# Patient Record
Sex: Female | Born: 1965 | Hispanic: Yes | Marital: Single | Smoking: Never smoker
Health system: Southern US, Community
[De-identification: ages and names within clinical notes are randomized; demographics above are authoritative.]

---

## 2007-07-14 ENCOUNTER — Ambulatory Visit: Payer: Self-pay

## 2007-07-16 ENCOUNTER — Ambulatory Visit: Payer: Self-pay

## 2008-03-04 HISTORY — PX: BREAST CYST ASPIRATION: SHX578

## 2008-08-10 ENCOUNTER — Ambulatory Visit: Payer: Self-pay

## 2008-10-07 ENCOUNTER — Emergency Department: Payer: Self-pay | Admitting: Emergency Medicine

## 2009-08-11 ENCOUNTER — Ambulatory Visit: Payer: Self-pay | Admitting: Family Medicine

## 2011-01-28 ENCOUNTER — Ambulatory Visit: Payer: Self-pay | Admitting: Family Medicine

## 2011-01-31 ENCOUNTER — Ambulatory Visit: Payer: Self-pay | Admitting: Family Medicine

## 2012-10-06 ENCOUNTER — Ambulatory Visit: Payer: Self-pay | Admitting: Family Medicine

## 2013-12-15 ENCOUNTER — Ambulatory Visit: Payer: Self-pay

## 2015-02-01 ENCOUNTER — Ambulatory Visit: Payer: Self-pay | Attending: Oncology

## 2015-02-01 ENCOUNTER — Ambulatory Visit
Admission: RE | Admit: 2015-02-01 | Discharge: 2015-02-01 | Disposition: A | Discharge status: Home / Self Care | Payer: Self-pay | Source: Ambulatory Visit | Attending: Oncology | Admitting: Oncology

## 2015-02-01 VITALS — BP 105/71 | HR 83 | Temp 99.3°F | Resp 16 | Ht 62.21 in | Wt 156.4 lb

## 2015-02-01 DIAGNOSIS — Z0000 Encounter for general adult medical examination without abnormal findings: Secondary | ICD-10-CM

## 2015-02-01 NOTE — Progress Notes (Signed)
Subjective:     Patient ID: Seward CarolAlva Cruz Cigarroa, female   DOB: 07/09/1965, 49 y.o.   MRN: [id number]  HPI   Review of Systems     Objective:   Physical Exam  Pulmonary/Chest: Right breast exhibits no inverted nipple, no mass, no nipple discharge, no skin change and no tenderness. Left breast exhibits no inverted nipple, no mass, no nipple discharge, no skin change and no tenderness. Breasts are symmetrical.       Assessment:     49 year old hispanic patient presents for [facility] clinic visit.  Patient screened, and meets [facility] eligibility.  Patient does not have insurance, Medicare or Medicaid.  Handout given on Affordable Care Act.  CBE unremarkable.  Instructed patient on breast self-exam using teach back method.    Plan:     Sent for bilateral screening mammogram.   Kristeen MansMaritza Afanador interpreted exam.

## 2015-02-08 NOTE — Progress Notes (Unsigned)
Letter mailed from [facility] to notify of normal mammogram results.  Patient to return in one year for annual screening.  Copy to [facility]. 

## 2016-02-05 ENCOUNTER — Encounter: Payer: Self-pay | Admitting: *Deleted

## 2016-02-05 ENCOUNTER — Ambulatory Visit
Admission: RE | Admit: 2016-02-05 | Discharge: 2016-02-05 | Disposition: A | Discharge status: Home / Self Care | Payer: Self-pay | Source: Ambulatory Visit | Attending: Oncology | Admitting: Oncology

## 2016-02-05 ENCOUNTER — Ambulatory Visit: Payer: Self-pay | Attending: Oncology | Admitting: *Deleted

## 2016-02-05 VITALS — BP 127/83 | HR 80 | Temp 98.1°F | Ht 62.6 in | Wt 158.5 lb

## 2016-02-05 DIAGNOSIS — Z0000 Encounter for general adult medical examination without abnormal findings: Secondary | ICD-10-CM

## 2016-02-05 NOTE — Patient Instructions (Signed)
Gave patient hand-out, Women Staying Healthy, Active and Well from [facility], with education on breast health, pap smears, heart and colon health. 

## 2016-02-05 NOTE — Progress Notes (Signed)
Subjective:     Patient ID: Kathryn May, female   DOB: 09/24/1965, 50 y.o.   MRN: [id number]  HPI   Review of Systems     Objective:   Physical Exam  Pulmonary/Chest: Right breast exhibits no inverted nipple, no mass, no nipple discharge, no skin change and no tenderness. Left breast exhibits no inverted nipple, no mass, no nipple discharge, no skin change and no tenderness. Breasts are symmetrical.         Assessment:     50 year old Hispanic [facility]  Maritza, the interpreter present during the interview and exam.  Patient states she found a nodule in the right breast about 4 months ago.  States her primary care provider told her it was probably related to menstrual cycle.  Patient states the nodule went away after her period.  On clinical breast exam there is no dominant mass, skin changes, nipple discharge or lymphadenopathy.  Taught self breast awareness.  Patient has been screened for eligibility.  She does not have any insurance, Medicare or Medicaid.  She also meets financial eligibility.  Hand-out given on the Affordable Care Act.    Plan:     Screening mammogram ordered.  Will follow-up per [facility] protocol.

## 2016-02-08 ENCOUNTER — Encounter: Payer: Self-pay | Admitting: *Deleted

## 2016-02-08 NOTE — Progress Notes (Signed)
Letter mailed to inform patient of her normal mammogram.  She is to return in one year for her annual screening.  [facility] to Dayhristy.

## 2017-02-05 ENCOUNTER — Ambulatory Visit: Payer: Self-pay | Attending: Oncology | Admitting: *Deleted

## 2017-02-05 ENCOUNTER — Encounter: Payer: Self-pay | Admitting: *Deleted

## 2017-02-05 ENCOUNTER — Ambulatory Visit
Admission: RE | Admit: 2017-02-05 | Discharge: 2017-02-05 | Disposition: A | Discharge status: Home / Self Care | Payer: Self-pay | Source: Ambulatory Visit | Attending: Oncology | Admitting: Oncology

## 2017-02-05 VITALS — BP 109/67 | HR 72 | Temp 97.5°F | Resp 18 | Ht 63.0 in | Wt 156.0 lb

## 2017-02-05 DIAGNOSIS — Z0000 Encounter for general adult medical examination without abnormal findings: Secondary | ICD-10-CM

## 2017-02-05 NOTE — Progress Notes (Signed)
Subjective:     Patient ID: Kathryn May, female   DOB: 1965-11-05, 51 y.o.   MRN: [id number]  HPI   Review of Systems     Objective:   Physical Exam  Pulmonary/Chest: Right breast exhibits no inverted nipple, no mass, no nipple discharge, no skin change and no tenderness. Left breast exhibits no inverted nipple, no mass, no nipple discharge, no skin change and no tenderness. Breasts are symmetrical.       Assessment:     51 year old Hispanic [facility]  [name], the interpreter present during the interview and exam.  Patient states her last pap on 9/17 was normal.  There is no pap report available for review.  Informed patient her next pap should be due in 2020 or 2022.  Clinical breast exam unremarkable.  Taught self breast awareness.  Patient has been screened for eligibility.  She does not have any insurance, Medicare or Medicaid.  She also meets financial eligibility.  Hand-out given on the Affordable Care Act.    Plan:     Screening mammogram ordered.  Will follow-up per [facility] protocol.

## 2017-02-05 NOTE — Patient Instructions (Signed)
Gave patient hand-out, Women Staying Healthy, Active and Well from [facility], with education on breast health, pap smears, heart and colon health. 

## 2017-02-06 ENCOUNTER — Encounter: Payer: Self-pay | Admitting: *Deleted

## 2017-02-06 NOTE — Progress Notes (Signed)
Letter mailed from [facility] to inform patient of her normal mammogram results.  Patient is to follow-up with annual screening in one year.  [facility] to Christy. 

## 2018-01-23 ENCOUNTER — Emergency Department
Admission: EM | Admit: 2018-01-23 | Discharge: 2018-01-23 | Disposition: A | Discharge status: Home / Self Care | Payer: Self-pay | Attending: Student in an Organized Health Care Education/Training Program | Admitting: Student in an Organized Health Care Education/Training Program

## 2018-01-23 ENCOUNTER — Encounter: Payer: Self-pay | Admitting: Emergency Medicine

## 2018-01-23 ENCOUNTER — Other Ambulatory Visit: Payer: Self-pay

## 2018-01-23 DIAGNOSIS — B029 Zoster without complications: Secondary | ICD-10-CM | POA: Insufficient documentation

## 2018-01-23 DIAGNOSIS — L299 Pruritus, unspecified: Secondary | ICD-10-CM | POA: Insufficient documentation

## 2018-01-23 MED ORDER — HYDROCODONE-ACETAMINOPHEN 5-325 MG PO TABS: 1.0000 | ORAL_TABLET | Freq: Four times a day (QID) | ORAL | 0 refills | Status: AC | PRN

## 2018-01-23 MED ORDER — HYDROXYZINE HCL 25 MG PO TABS: 25.0000 mg | ORAL_TABLET | Freq: Four times a day (QID) | ORAL | 0 refills | Status: AC | PRN

## 2018-01-23 MED ORDER — ACYCLOVIR 400 MG PO TABS: 800.0000 mg | ORAL_TABLET | Freq: Every day | ORAL | 0 refills | Status: AC

## 2018-01-23 MED ORDER — DIPHENHYDRAMINE HCL 25 MG PO CAPS
25.0000 mg | ORAL_CAPSULE | Freq: Once | ORAL | Status: AC
  Administered 2018-01-23: 25 mg via ORAL
  Filled 2018-01-23: qty 1

## 2018-01-23 MED ORDER — HYDROCODONE-ACETAMINOPHEN 5-325 MG PO TABS
1.0000 | ORAL_TABLET | Freq: Once | ORAL | Status: AC
  Administered 2018-01-23: 1 via ORAL
  Filled 2018-01-23: qty 1

## 2018-01-23 NOTE — ED Notes (Addendum)
See triage note  States she developed pain to left lower back which is moving into left leg   States pain started 4 days ago  No injury .  Ambulates well to treatment room  Also has rash to  Same leg

## 2018-01-23 NOTE — ED Provider Notes (Signed)
[facility] Emergency Department Provider Note  ____________________________________________   First MD Initiated Contact with Patient 01/23/18 1407     (approximate)  I have reviewed the triage vital signs and the nursing notes.   HISTORY  Chief Complaint Sciatica and Rash Spanish interpreter and family member.   HPI Kathryn May is a 52 y.o. female presents to the ED with complaint of left lower extremity and rash that began 4 days ago.  Patient states the rash is painful and also itches.  She denies rash on any other part of her body.  She denies any back injury.  Rates her pain as an 8 out of 10.   History reviewed. No pertinent past medical history.  There are no active problems to display for this patient.   Past Surgical History:  Procedure Laterality Date  . BREAST CYST ASPIRATION Right 2010    Prior to Admission medications   Medication Sig Start Date End Date Taking? Authorizing Provider  acyclovir (ZOVIRAX) 400 MG tablet Take 2 tablets (800 mg total) by mouth 5 (five) times daily for 7 days. 01/23/18 01/30/18  Tommi Rumps, PA-C  HYDROcodone-acetaminophen (NORCO/VICODIN) 5-325 MG tablet Take 1 tablet by mouth every 6 (six) hours as needed for moderate pain. 01/23/18   Tommi Rumps, PA-C  hydrOXYzine (ATARAX/VISTARIL) 25 MG tablet Take 1 tablet (25 mg total) by mouth every 6 (six) hours as needed for itching. 01/23/18   Tommi Rumps, PA-C    Allergies Patient has no known allergies.  Family History  Problem Relation Age of Onset  . Breast cancer Neg Hx     Social History Social History   Tobacco Use  . Smoking status: Never Smoker  . Smokeless tobacco: Never Used  Substance Use Topics  . Alcohol use: No    Alcohol/week: 0.0 standard drinks  . Drug use: No    Review of Systems Constitutional: No fever/chills Eyes: No visual changes. ENT: No sore throat. Cardiovascular: Denies chest  pain. Respiratory: Denies shortness of breath. Gastrointestinal: No abdominal pain.  No nausea, no vomiting.  No diarrhea.  No constipation. Genitourinary: Negative for dysuria. Musculoskeletal: Positive for low back pain with radiculopathy down the left leg. Skin: Positive for rash left leg. Neurological: Negative for headaches, focal weakness or numbness. ____________________________________________   PHYSICAL EXAM:  VITAL SIGNS: ED Triage Vitals  Enc Vitals Group     BP 01/23/18 1315 137/76     Pulse Rate 01/23/18 1315 87     Resp 01/23/18 1315 16     Temp 01/23/18 1315 98.6 F (37 C)     Temp src --      SpO2 01/23/18 1315 98 %     Weight 01/23/18 1321 140 lb (63.5 kg)     Height 01/23/18 1321 5\' 5"  (1.651 m)     Head Circumference --      Peak Flow --      Pain Score 01/23/18 1321 8     Pain Loc --      Pain Edu? --      Excl. in GC? --    Constitutional: Alert and oriented. Well appearing and in no acute distress. Eyes: Conjunctivae are normal.  Head: Atraumatic. Neck: No stridor.   Cardiovascular: Normal rate, regular rhythm. Grossly normal heart sounds.  Good peripheral circulation. Respiratory: Normal respiratory effort.  No retractions. Lungs CTAB. Musculoskeletal: Examination of low back there is no gross deformity and no point tenderness on palpation.  There is some discomfort on palpation of the left SI joint area.  Range of motion is without restriction.  No muscle spasms were noted.  Patient is able to ambulate without assistance.  Good muscle strength bilaterally. Neurologic:  Normal speech and language. No gross focal neurologic deficits are appreciated. No gait instability. Skin:  Skin is warm, dry.  On examination of the left upper thigh there is an erythematous vesicular/papular rash involving the left lower extremity only.  These vesicles are noted in clusters.  Exam is consistent with herpes zoster.  Patient was undressed and rash was limited to the left  extremity only. Psychiatric: Mood and affect are normal. Speech and behavior are normal.  ____________________________________________   LABS (all labs ordered are listed, but only abnormal results are displayed)  Labs Reviewed - No data to display   PROCEDURES  Procedure(s) performed: None  Procedures  Critical Care performed: No  ____________________________________________   INITIAL IMPRESSION / ASSESSMENT AND PLAN / ED COURSE  As part of my medical decision making, I reviewed the following data within the electronic MEDICAL RECORD NUMBER Notes from prior ED visits and [place] Controlled Substance Database  Patient was given Norco and Benadryl while in the emergency department.  Per Spanish interpreter it was explained to her that herpes zoster was from the same virus causing chickenpox.  Patient acknowledges that she understands and that she did have chickenpox in her childhood.  Patient was given a prescription for Zovirax 800 mg 5 times per day for the next 7 days.  She is also given a prescription for Atarax if needed for itching and Norco if needed for pain.  Patient was also encouraged not to be around pregnant women or people receiving cancer.  She was given a note to remain out of work.  ____________________________________________   FINAL CLINICAL IMPRESSION(S) / ED DIAGNOSES  Final diagnoses:  Herpes zoster without complication     ED Discharge Orders         Ordered    acyclovir (ZOVIRAX) 400 MG tablet  5 times daily     01/23/18 1448    HYDROcodone-acetaminophen (NORCO/VICODIN) 5-325 MG tablet  Every 6 hours PRN     01/23/18 1448    hydrOXYzine (ATARAX/VISTARIL) 25 MG tablet  Every 6 hours PRN     01/23/18 1448           Note:  This document was prepared using Dragon voice recognition software and may include unintentional dictation errors.    Tommi RumpsSummers, Rhonda L, PA-C 01/23/18 1909    Willy Eddyobinson, Patrick, MD 01/24/18 [phone]

## 2018-01-23 NOTE — Discharge Instructions (Addendum)
See your primary care provider if any continued problems.  Take medication as prescribed. Return to the emergency department if any severe worsening of your symptoms.

## 2018-01-23 NOTE — ED Triage Notes (Signed)
Pt in via POV with complaints of sciatica pain from left hip radiating down leg.  Pt also reports waking up with hives/itching this morning.  Pt denies any new meds/soaps/foods.  Vitals WDL, NAD noted at this time.

## 2018-04-29 ENCOUNTER — Ambulatory Visit
Admission: RE | Admit: 2018-04-29 | Discharge: 2018-04-29 | Disposition: A | Discharge status: Home / Self Care | Payer: Self-pay | Source: Ambulatory Visit | Attending: Oncology | Admitting: Oncology

## 2018-04-29 ENCOUNTER — Ambulatory Visit: Payer: Self-pay | Attending: Oncology

## 2018-04-29 ENCOUNTER — Encounter (INDEPENDENT_AMBULATORY_CARE_PROVIDER_SITE_OTHER): Payer: Self-pay

## 2018-04-29 VITALS — BP 125/80 | HR 80 | Temp 98.3°F | Ht 61.0 in | Wt 156.7 lb

## 2018-04-29 DIAGNOSIS — Z0000 Encounter for general adult medical examination without abnormal findings: Secondary | ICD-10-CM | POA: Insufficient documentation

## 2018-04-29 NOTE — Progress Notes (Signed)
  Subjective:     Patient ID: Kathryn May, female   DOB: [birth year], 53 y.o.   MRN: [id number]  HPI   Review of Systems     Objective:   Physical Exam Chest:     Breasts:        Right: No swelling, bleeding, inverted nipple, mass, nipple discharge, skin change or tenderness.        Left: No swelling, bleeding, inverted nipple, mass, nipple discharge, skin change or tenderness.        Assessment:     53 year old hispanic patient presents for [facility] clinic visit.  Delos Haring interpreted exam.  Patient screened, and meets [facility] eligibility.  Patient does not have insurance, Medicare or Medicaid.  Handout given on Affordable Care Act.  Instructed patient on breast self awareness using teach back method.  Clinical breast exam unremarkable.  No mass or lump palpated.  Risk Assessment    Risk Scores      04/29/2018   Last edited by: Alta Corning, CMA   5-year risk: 0.6 %   Lifetime risk: 5.2 %             Plan:     Sent for bilateral screening mammogram.

## 2018-05-01 NOTE — Progress Notes (Signed)
Letter mailed from [facility] to notify of normal mammogram results.  Patient to return in one year for annual screening.  Copy to [facility]. 

## 2019-04-13 ENCOUNTER — Other Ambulatory Visit: Payer: Self-pay

## 2019-04-13 ENCOUNTER — Ambulatory Visit
Admission: RE | Admit: 2019-04-13 | Discharge: 2019-04-13 | Disposition: A | Discharge status: Home / Self Care | Payer: Self-pay | Source: Ambulatory Visit | Attending: Oncology | Admitting: Oncology

## 2019-04-13 ENCOUNTER — Ambulatory Visit: Payer: Self-pay | Attending: Oncology

## 2019-04-13 VITALS — BP 134/82 | HR 74 | Temp 96.9°F | Ht 62.0 in | Wt 168.0 lb

## 2019-04-13 DIAGNOSIS — Z0000 Encounter for general adult medical examination without abnormal findings: Secondary | ICD-10-CM | POA: Insufficient documentation

## 2019-04-13 DIAGNOSIS — N63 Unspecified lump in unspecified breast: Secondary | ICD-10-CM

## 2019-04-13 NOTE — Progress Notes (Signed)
  Subjective:     Patient ID: Kathryn May, female   DOB: August 21, 1965, 54 y.o.   MRN: [id number]  HPI   Review of Systems     Objective:   Physical Exam Chest:     Breasts:        Right: No swelling, bleeding, inverted nipple, mass, nipple discharge, skin change or tenderness.        Left: No swelling, bleeding, inverted nipple, mass, nipple discharge, skin change or tenderness.  Genitourinary:    Labia:        Right: No rash, tenderness, lesion or injury.        Left: No rash, tenderness, lesion or injury.      Vagina: No signs of injury and foreign body. No vaginal discharge, erythema, tenderness, bleeding, lesions or prolapsed vaginal walls.     Cervix: No cervical motion tenderness, discharge, friability, lesion, erythema, cervical bleeding or eversion.     Uterus: Not deviated, not enlarged, not fixed, not tender and no uterine prolapse.      Adnexa:        Right: No mass, tenderness or fullness.         Left: No mass, tenderness or fullness.          Assessment:  54 year old hispanic patient presents for [facility] clinic visit. Patient screened, and meets [facility] eligibility.  Patient does not have insurance, Medicare or Medicaid. Instructed patient on breast self awareness using teach back method. Clinical breast exam unremarkable. No mass or lump palpated.  Pelvic exam normal.     Risk Assessment    Risk Scores      04/13/2019 04/29/2018   Last edited by: Jim Like, RN [place], Freada Bergeron, CMA   5-year risk: 0.5 % 0.6 %   Lifetime risk: 4.3 % 5.2 %          Plan:     Sent for bilateral screening mammogram. Specimen collected for pap.

## 2019-04-21 LAB — IGP, APTIMA HPV: HPV Aptima: NEGATIVE

## 2019-04-22 NOTE — Progress Notes (Unsigned)
Normal pap results.  Additional views mammogram/ultrasound pending.

## 2019-04-30 ENCOUNTER — Ambulatory Visit
Admission: RE | Admit: 2019-04-30 | Discharge: 2019-04-30 | Disposition: A | Discharge status: Home / Self Care | Payer: Self-pay | Source: Ambulatory Visit | Attending: Oncology | Admitting: Oncology

## 2019-04-30 ENCOUNTER — Other Ambulatory Visit: Payer: Self-pay

## 2019-04-30 DIAGNOSIS — N63 Unspecified lump in unspecified breast: Secondary | ICD-10-CM

## 2019-05-04 ENCOUNTER — Other Ambulatory Visit: Payer: Self-pay | Admitting: *Deleted

## 2019-05-04 DIAGNOSIS — N63 Unspecified lump in unspecified breast: Secondary | ICD-10-CM

## 2019-05-06 ENCOUNTER — Ambulatory Visit
Admission: RE | Admit: 2019-05-06 | Discharge: 2019-05-06 | Disposition: A | Discharge status: Home / Self Care | Payer: Self-pay | Source: Ambulatory Visit | Attending: Oncology | Admitting: Oncology

## 2019-05-06 DIAGNOSIS — N63 Unspecified lump in unspecified breast: Secondary | ICD-10-CM

## 2019-05-06 HISTORY — PX: BREAST BIOPSY: SHX20

## 2019-05-07 LAB — SURGICAL PATHOLOGY

## 2019-05-18 ENCOUNTER — Other Ambulatory Visit: Payer: Self-pay

## 2019-05-18 DIAGNOSIS — Z0000 Encounter for general adult medical examination without abnormal findings: Secondary | ICD-10-CM

## 2019-05-18 DIAGNOSIS — N63 Unspecified lump in unspecified breast: Secondary | ICD-10-CM

## 2019-05-18 NOTE — Progress Notes (Signed)
Patient ID: Kathryn May, female   DOB: May 18, 1965, 54 y.o.   MRN: [id number] Radiologist discussed benign biopsy results with patient.  Scheduled for 6 month follow-up mammogram per radiology recommendation.  Mailed appointment information.

## 2019-07-10 ENCOUNTER — Ambulatory Visit: Payer: Self-pay | Attending: Internal Medicine

## 2019-07-10 DIAGNOSIS — Z23 Encounter for immunization: Secondary | ICD-10-CM

## 2019-07-10 NOTE — Progress Notes (Signed)
   Covid-19 Vaccination Clinic  Name:  Kathryn May    MRN: [id number] DOB: 07-20-1965  07/10/2019  Kathryn May was observed post Covid-19 immunization for 15 minutes without incident. She was provided with Vaccine Information Sheet and instruction to access the V-Safe system.   Kathryn May was instructed to call 911 with any severe reactions post vaccine: Marland Kitchen Difficulty breathing  . Swelling of face and throat  . A fast heartbeat  . A bad rash all over body  . Dizziness and weakness   Immunizations Administered    Name Date Dose VIS Date Route   Pfizer COVID-19 Vaccine 07/10/2019 12:16 PM 0.3 mL 04/28/2018 Intramuscular   Manufacturer: [employer], [employer]   Lot: [id number]   NDC: [address]-6

## 2019-07-13 ENCOUNTER — Ambulatory Visit: Payer: Self-pay

## 2019-08-03 ENCOUNTER — Ambulatory Visit: Payer: Self-pay | Attending: Internal Medicine

## 2019-08-03 DIAGNOSIS — Z23 Encounter for immunization: Secondary | ICD-10-CM

## 2019-08-03 NOTE — Progress Notes (Signed)
   Covid-19 Vaccination Clinic  Name:  Kathryn May    MRN: [id number] DOB: October 03, 1965  08/03/2019  Ms. Kathryn May was observed post Covid-19 immunization for 15 minutes without incident. She was provided with Vaccine Information Sheet and instruction to access the V-Safe system.   Ms. Kathryn May was instructed to call 911 with any severe reactions post vaccine: Marland Kitchen Difficulty breathing  . Swelling of face and throat  . A fast heartbeat  . A bad rash all over body  . Dizziness and weakness   Immunizations Administered    Name Date Dose VIS Date Route   Pfizer COVID-19 Vaccine 08/03/2019  9:10 AM 0.3 mL 04/28/2018 Intramuscular   Manufacturer: [employer], [employer]   Lot: [id number]   NDC: [address]-9

## 2019-11-09 ENCOUNTER — Ambulatory Visit
Admission: RE | Admit: 2019-11-09 | Discharge: 2019-11-09 | Disposition: A | Discharge status: Home / Self Care | Payer: Self-pay | Source: Ambulatory Visit | Attending: Oncology | Admitting: Oncology

## 2019-11-09 DIAGNOSIS — N63 Unspecified lump in unspecified breast: Secondary | ICD-10-CM | POA: Insufficient documentation

## 2019-11-09 DIAGNOSIS — Z0000 Encounter for general adult medical examination without abnormal findings: Secondary | ICD-10-CM | POA: Insufficient documentation

## 2020-06-27 ENCOUNTER — Ambulatory Visit
Admission: RE | Admit: 2020-06-27 | Discharge: 2020-06-27 | Disposition: A | Discharge status: Home / Self Care | Payer: Self-pay | Source: Ambulatory Visit | Attending: Oncology | Admitting: Oncology

## 2020-06-27 ENCOUNTER — Ambulatory Visit: Payer: Self-pay | Attending: Oncology | Admitting: *Deleted

## 2020-06-27 ENCOUNTER — Other Ambulatory Visit: Payer: Self-pay

## 2020-06-27 VITALS — BP 146/80 | HR 66 | Temp 97.6°F | Ht 62.0 in | Wt 167.2 lb

## 2020-06-27 DIAGNOSIS — Z0000 Encounter for general adult medical examination without abnormal findings: Secondary | ICD-10-CM | POA: Insufficient documentation

## 2020-06-27 NOTE — Progress Notes (Signed)
Subjective:     Patient ID: Kathryn May, female   DOB: 1965-05-17, 55 y.o.   MRN: [id number]  HPI   [facility] Medical History Record - 06/27/20 6962      Breast History   Screening cycle New    Initial Mammogram 06/27/20    Last Mammogram Annual    Last Mammogram Date 11/09/19    Provider (Mammogram)  Delford Field    Recent Breast Symptoms None      Breast Cancer History   Breast Cancer History No personal or family history      Previous History of Breast Problems   Breast Surgery or Biopsy Left   benign; March 2021   Breast Implants N/A    BSE Done Monthly      Gynecological/Obstetrical History   LMP --   2 weeks ago   Is there any chance that the client could be pregnant?  No    Age at menarche 54    Age at menopause perimenopausal    PAP smear history Annually    Date of last PAP  04/13/19    Provider (PAP) Bernestine Amass neg/neg    Age at first live birth 72    Breast fed children Yes (type length in comments)   2 years   DES Exposure No    Cervical, Uterine or Ovarian cancer No    Family history of Cervial, Uterine or Ovarian cancer No    Hysterectomy No    Cervix removed No    Ovaries removed No    Laser/Cryosurgery No    Current method of birth control --   tubal ligation   Current method of Estrogen/Hormone replacement None    Smoking history None    Comments no insurance             Review of Systems     Objective:   Physical Exam Chest:  Breasts:     Right: No swelling, bleeding, inverted nipple, mass, nipple discharge, skin change, tenderness, axillary adenopathy or supraclavicular adenopathy.     Left: No swelling, bleeding, inverted nipple, mass, nipple discharge, skin change, tenderness, axillary adenopathy or supraclavicular adenopathy.    Lymphadenopathy:     Upper Body:     Right upper body: No supraclavicular or axillary adenopathy.     Left upper body: No supraclavicular or axillary adenopathy.        Assessment:     55 year old  Hispanic female returns to [facility] for annual screening.  Loyda, the interpreter present during the interview and exam.  Clinical breast exam    Taught self breast awareness.  Last pap smear on 04/13/19 was negative / negative.  Next pap due in 2026.  Patient states she had a menstrual cycle 2 weeks ago.  It is unclear if this was post menopausal bleeding or if she is perimenopausal.  Patient states she has not had a period in a year, but then reported last year that she had not had a period since 2018.  Patient wast encouraged to discuss this last menstrual cycle with her PCP.  Patient has an appointment in 2 weeks.  Reviewed that post menopausal bleeding is abnormal and that a GYN consult may be needed.  Patient has been screened for eligibility.  She does not have any insurance, Medicare or Medicaid.  She also meets financial eligibility.   Risk Assessment    Risk Scores      06/27/2020 04/13/2019   Last edited by: Coralee Rud  F, RN Jim Like, RN   5-year risk: 0.6 % 0.5 %   Lifetime risk: 4.2 % 4.3 %            Plan:     Screening mammogram ordered.  Will follow up per [facility] protocol.

## 2020-06-27 NOTE — Patient Instructions (Signed)
Gave patient hand-out, Women Staying Healthy, Active and Well from [facility], with education on breast health, pap smears, heart and colon health. 

## 2020-06-28 ENCOUNTER — Encounter: Payer: Self-pay | Admitting: *Deleted

## 2020-06-28 NOTE — Progress Notes (Signed)
Letter mailed from [facility] to inform patient of her normal mammogram results.  Patient is to follow-up with annual screening in one year. 

## 2021-01-24 ENCOUNTER — Encounter: Payer: Self-pay | Admitting: Physician Assistant

## 2021-07-02 ENCOUNTER — Other Ambulatory Visit: Payer: Self-pay

## 2021-07-02 DIAGNOSIS — Z1211 Encounter for screening for malignant neoplasm of colon: Secondary | ICD-10-CM

## 2021-07-02 DIAGNOSIS — Z1231 Encounter for screening mammogram for malignant neoplasm of breast: Secondary | ICD-10-CM

## 2021-07-04 ENCOUNTER — Ambulatory Visit
Admission: RE | Admit: 2021-07-04 | Discharge: 2021-07-04 | Disposition: A | Discharge status: Home / Self Care | Payer: Self-pay | Source: Ambulatory Visit | Attending: Obstetrics and Gynecology | Admitting: Obstetrics and Gynecology

## 2021-07-04 ENCOUNTER — Ambulatory Visit: Payer: Self-pay | Attending: Hematology and Oncology | Admitting: *Deleted

## 2021-07-04 VITALS — BP 141/92 | Wt 173.0 lb

## 2021-07-04 DIAGNOSIS — Z1239 Encounter for other screening for malignant neoplasm of breast: Secondary | ICD-10-CM

## 2021-07-04 DIAGNOSIS — Z1231 Encounter for screening mammogram for malignant neoplasm of breast: Secondary | ICD-10-CM | POA: Insufficient documentation

## 2021-07-04 NOTE — Patient Instructions (Signed)
Explained breast self awareness with Kathryn May. Patient did not need a Pap smear today due to last Pap smear was in January or February 2023 per patient. Kathryn her know [facility] will cover Pap smears every 3 years unless has a history of abnormal Pap smears. Referred patient to the [facility] for a screening mammogram. Appointment scheduled Wednesday, Jul 04, 2021 following [facility] appointment. Patient aware of appointment and will be there. Kathryn patient know Kathryn May will follow up with her within the next couple weeks with results of mammogram by letter or phone. Kathryn May verbalized understanding. ? ?Kathryn May, Kathryn Maser, RN ?10:04 AM ? ? ? ? ?

## 2021-07-04 NOTE — Progress Notes (Signed)
Kathryn May is a 56 y.o. female [facility].  ?  ?Pap Smear: Pap smear not completed today. Last Pap smear was in January or February 2023 at [facility] and was normal per patient. Patient has history of an abnormal Pap smear 08/13/2002 that was ASCUS that no follow up was completed. Last Pap smear result is not available in Epic. Previous Pap smear result from 04/13/2019 is available in Epic. ?  ?Physical exam: ?Breasts ?Right breast slightly larger than left breast that per patient is normal for her. No skin abnormalities bilateral breasts. No nipple retraction bilateral breasts. No nipple discharge bilateral breasts. No lymphadenopathy. No lumps palpated bilateral breasts. No complaints of pain or tenderness on exam. ? ?MS DIGITAL SCREENING TOMO BILATERAL ? ?Result Date: 06/28/2020 ?CLINICAL DATA:  Screening. History of benign LEFT breast biopsy in 2021. EXAM: DIGITAL SCREENING BILATERAL MAMMOGRAM WITH TOMOSYNTHESIS AND CAD TECHNIQUE: Bilateral screening digital craniocaudal and mediolateral oblique mammograms were obtained. Bilateral screening digital breast tomosynthesis was performed. The images were evaluated with computer-aided detection. COMPARISON:  Previous exam(s). ACR Breast Density Category c: The breast tissue is heterogeneously dense, which may obscure small masses. FINDINGS: There are no findings suspicious for malignancy. The images were evaluated with computer-aided detection. IMPRESSION: No mammographic evidence of malignancy. A result letter of this screening mammogram will be mailed directly to the patient. RECOMMENDATION: Screening mammogram in one year. (Code:[id number]-B-01Y) BI-RADS CATEGORY  1: Negative. Electronically Signed   By: Bary RichardStan  Maynard M.D.   On: 06/28/2020 08:51  ? ?MS DIGITAL SCREENING TOMO BILATERAL ? ?Result Date: 04/13/2019 ?CLINICAL DATA:  Screening. EXAM: DIGITAL SCREENING BILATERAL MAMMOGRAM WITH TOMO AND CAD  COMPARISON:  Previous exam(s). ACR Breast Density Category c: The breast tissue is heterogeneously dense, which may obscure small masses. FINDINGS: In the left breast, possible distortion warrants further evaluation. In the right breast, no findings suspicious for malignancy. Images were processed with CAD. IMPRESSION: Further evaluation is suggested for possible distortion in the left breast. RECOMMENDATION: Diagnostic mammogram and possibly ultrasound of the left breast. (Code:[id number]-82M) The patient will be contacted regarding the findings, and additional imaging will be scheduled. BI-RADS CATEGORY  0: Incomplete. Need additional imaging evaluation and/or prior mammograms for comparison. Electronically Signed   By: Edwin CapJennifer  Jarosz M.D.   On: 04/13/2019 12:44  ? ?MS DIGITAL SCREENING TOMO BILATERAL ? ?Result Date: 04/29/2018 ?CLINICAL DATA:  Screening. EXAM: DIGITAL SCREENING BILATERAL MAMMOGRAM WITH TOMO AND CAD COMPARISON:  Previous exam(s). ACR Breast Density Category b: There are scattered areas of fibroglandular density. FINDINGS: There are no findings suspicious for malignancy. Images were processed with CAD. IMPRESSION: No mammographic evidence of malignancy. A result letter of this screening mammogram will be mailed directly to the patient. RECOMMENDATION: Screening mammogram in one year. (Code:[id number]-B-01Y) BI-RADS CATEGORY  1: Negative. Electronically Signed   By: Baird Lyonsina  Arceo M.D.   On: 04/29/2018 15:34  ? ?MS DIGITAL SCREENING TOMO BILATERAL ? ?Result Date: 02/05/2017 ?CLINICAL DATA:  Screening. EXAM: 2D DIGITAL SCREENING BILATERAL MAMMOGRAM WITH CAD AND ADJUNCT TOMO COMPARISON:  Previous exam(s). ACR Breast Density Category c: The breast tissue is heterogeneously dense, which may obscure small masses. FINDINGS: There are no findings suspicious for malignancy. Images were processed with CAD. IMPRESSION: No mammographic evidence of malignancy. A result letter of this screening mammogram will be mailed directly  to the patient. RECOMMENDATION: Screening mammogram in one year. (Code:[id number]-01Y) BI-RADS CATEGORY  1: Negative. Electronically Signed   By:  Sande Brothers M.D.   On: 02/05/2017 12:14  ? ?MS DIGITAL DIAG TOMO UNI LEFT ? ?Result Date: 11/09/2019 ?CLINICAL DATA:  Patient for short-term follow-up of biopsy site left breast demonstrating benign pathology. EXAM: DIGITAL DIAGNOSTIC UNILATERAL LEFT MAMMOGRAM WITH TOMO AND CAD COMPARISON:  Previous exam(s). ACR Breast Density Category d: The breast tissue is extremely dense, which lowers the sensitivity of mammography. FINDINGS: Stable post biopsy changes left breast. No new masses, calcifications or nonsurgical distortion identified. Mammographic images were processed with CAD. IMPRESSION: No mammographic evidence for malignancy. Post biopsy clip left breast. RECOMMENDATION: Return to annual screening mammography 04/2020. I have discussed the findings and recommendations with the patient. If applicable, a reminder letter will be sent to the patient regarding the next appointment. BI-RADS CATEGORY  2: Benign. Electronically Signed   By: Annia Belt M.D.   On: 11/09/2019 14:08  ? ?MS DIGITAL DIAG TOMO UNI LEFT ? ?Result Date: 04/30/2019 ?CLINICAL DATA:  The patient was called back for possible distortion in the left breast. EXAM: DIGITAL DIAGNOSTIC LEFT MAMMOGRAM WITH TOMO ULTRASOUND LEFT BREAST COMPARISON:  Previous exam(s). ACR Breast Density Category b: There are scattered areas of fibroglandular density. FINDINGS: Possible distortion in the superior left breast on the MLO view only persists on additional imaging. On physical exam, no suspicious lumps are identified. Targeted ultrasound is performed, showing no definitive sonographic correlate to the possible distortion in the left breast. No axillary adenopathy. IMPRESSION: Persistent possible distortion in the superior left breast on the MLO view only. RECOMMENDATION: Recommend stereotactic biopsy of the possible left  breast distortion. I have discussed the findings and recommendations with the patient. If applicable, a reminder letter will be sent to the patient regarding the next appointment. BI-RADS CATEGORY  4: Suspicious. Electronically Signed   By: Gerome Sam III M.D   On: 04/30/2019 10:49  ? ?MS DIGITAL DIAG UNI LEFT ? ?Result Date: 05/06/2019 ?CLINICAL DATA:  Evaluate biopsy marker EXAM: DIAGNOSTIC LEFT MAMMOGRAM POST STEREOTACTIC BIOPSY COMPARISON:  Previous exam(s). FINDINGS: Mammographic images were obtained following stereotactic guided biopsy of possible left distortion. The biopsy marking clip is just posterior to the possible distortion. There is air in the region of possible distortion and I believe the region was sampled. IMPRESSION: Appropriate positioning of the coil shaped biopsy marking clip at the site of biopsy just posterior to the biopsied possible distortion. Final Assessment: Post Procedure Mammograms for Marker Placement Electronically Signed   By: Gerome Sam III M.D   On: 05/06/2019 13:41  ? ?MM LT BREAST BX W LOC DEV 1ST LESION IMAGE BX SPEC STEREO GUIDE ? ?Addendum Date: 05/11/2019   ?ADDENDUM REPORT: 05/10/2019 13:15 ADDENDUM: PATHOLOGY revealed: A. BREAST, LEFT UPPER OUTER QUADRANT; STEREOTACTIC CORE BIOPSY: - BENIGN MAMMARY PARENCHYMA WITH PATCHY STROMAL FIBROSIS. - NEGATIVE FOR ATYPICAL PROLIFERATIVE BREAST DISEASE. Pathology results are CONCORDANT with imaging findings, per Dr. Gerome Sam. Pathology results and recommendations below were discussed with patient by telephone on 05/10/2019. Patient reported biopsy site doing well with slight tenderness at the site. Post biopsy care instructions were reviewed and questions were answered. Patient was instructed to call [facility] if any concerns or questions arise related to the biopsy. RECOMMENDATION: Patient instructed to return in six months for unilateral LEFT breast diagnostic mammogram to follow up on biopsied area. Patient  informed a reminder notice will be sent regarding this appointment. Addendum by Randa Lynn RN on 05/10/2019. Electronically Signed   By: Gerome Sam III M.D   On:  05/10/2019 13:15  ? ?Result Date: 05/11/2019

## 2021-08-28 IMAGING — MG DIGITAL SCREENING BILAT W/ TOMO W/ CAD
8 series · 8 of 24 positions shown · non-contrast
Comparison: Previous exam(s).

CLINICAL DATA: Screening.

EXAM:
DIGITAL SCREENING BILATERAL MAMMOGRAM WITH TOMO AND CAD

[L CC synth-2D]
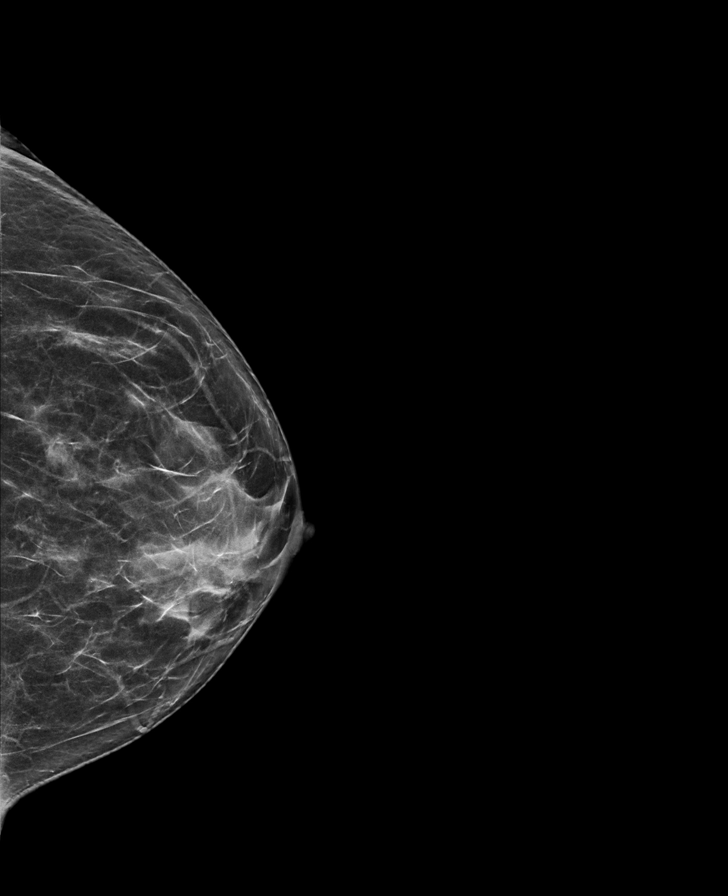

[L MLO synth-2D]
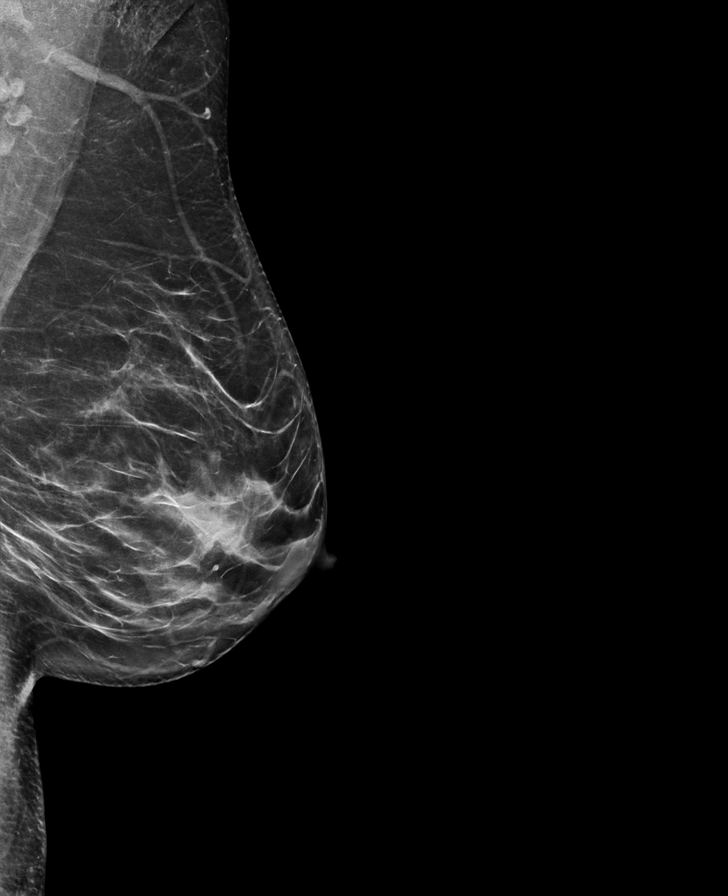

[R MLO synth-2D]
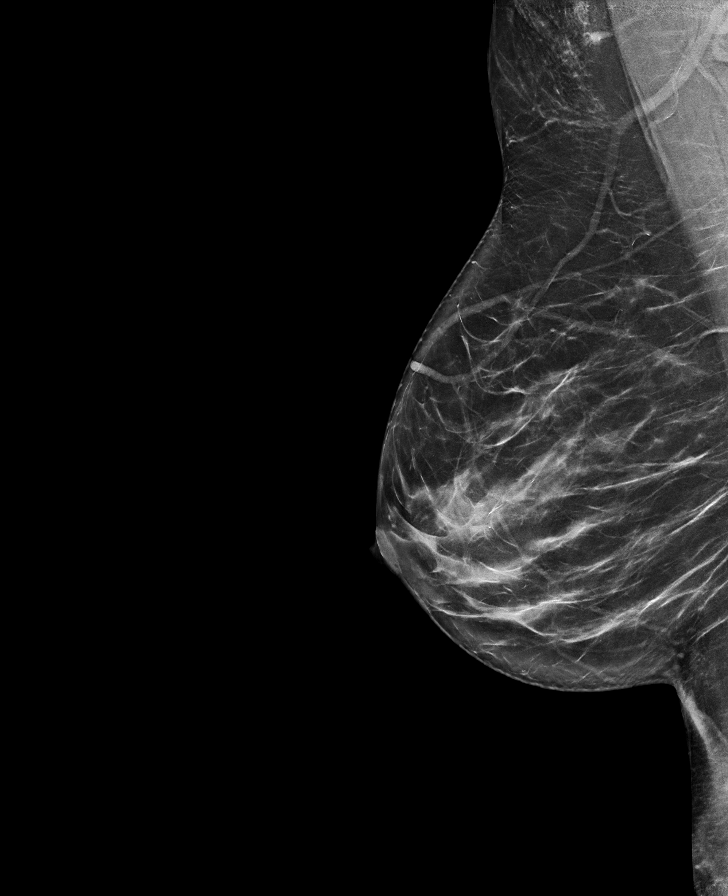

[R CC synth-2D]
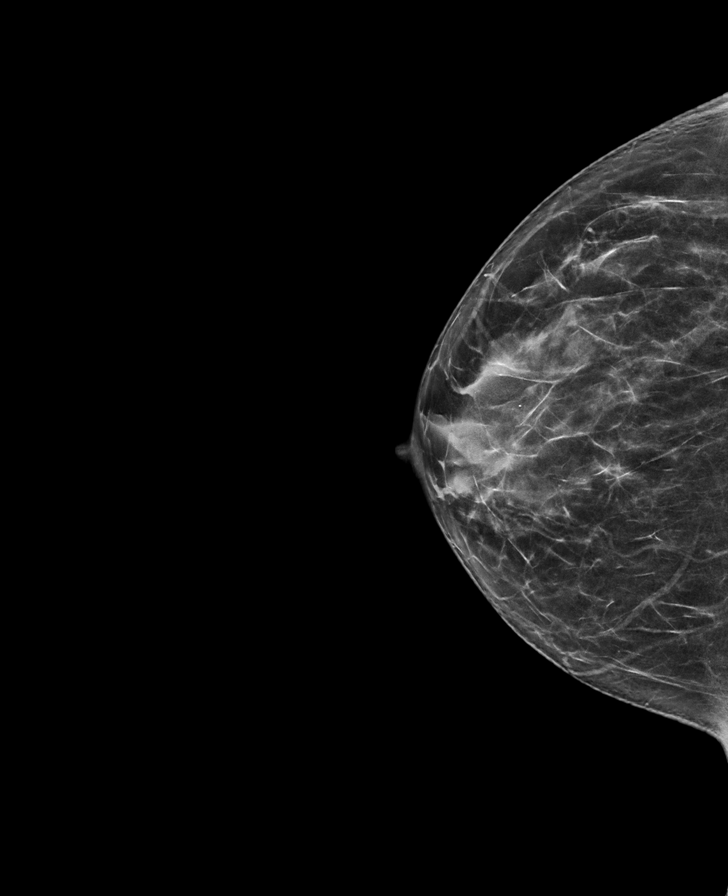

[L MLO tomo · tomo slice 39/78.0]
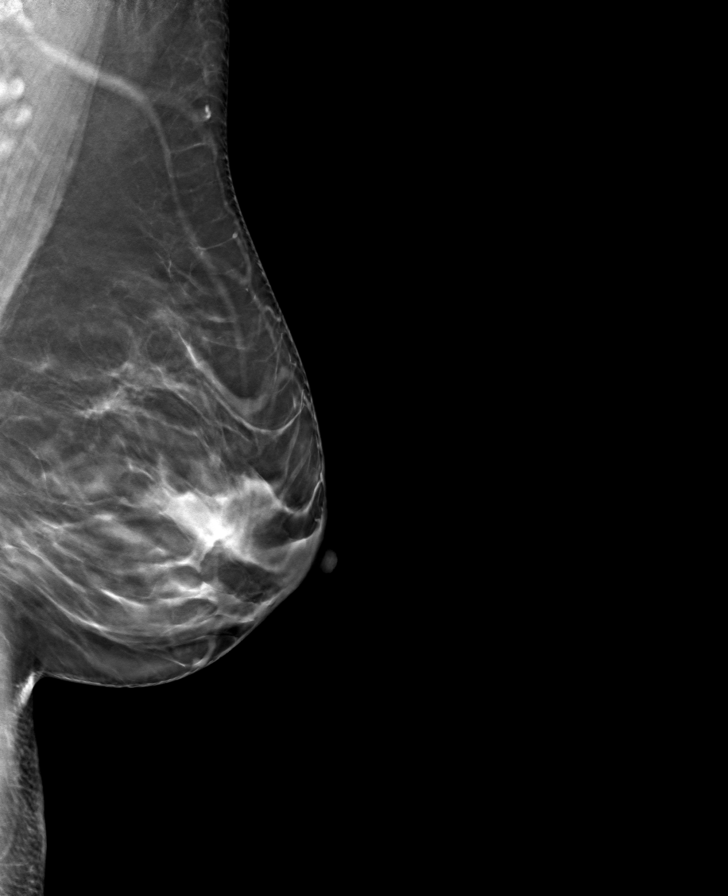

[R MLO tomo · tomo slice 40/79.0]
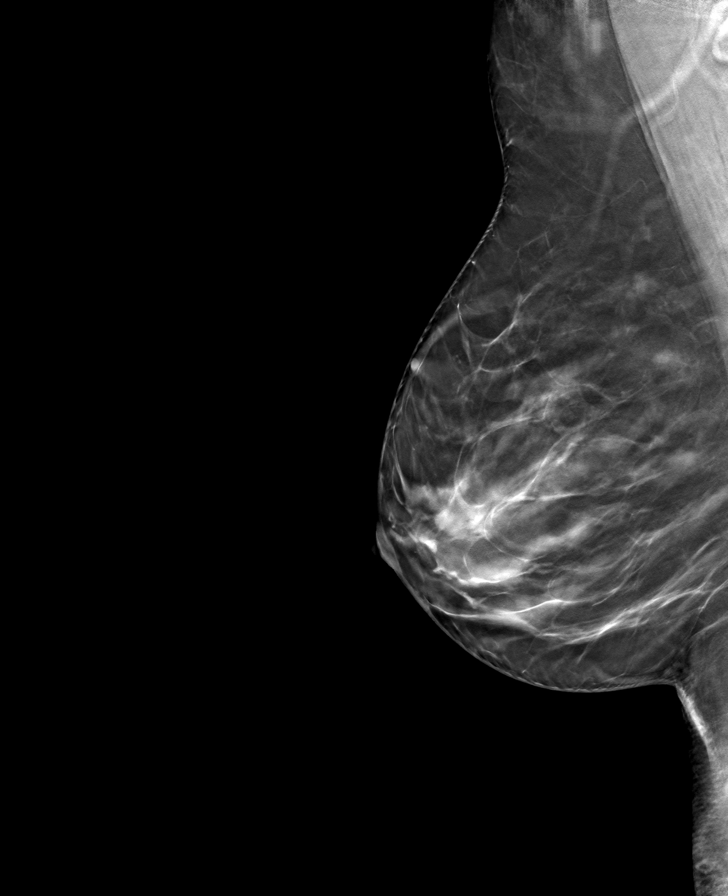

[R CC tomo · tomo slice 33/66.0]
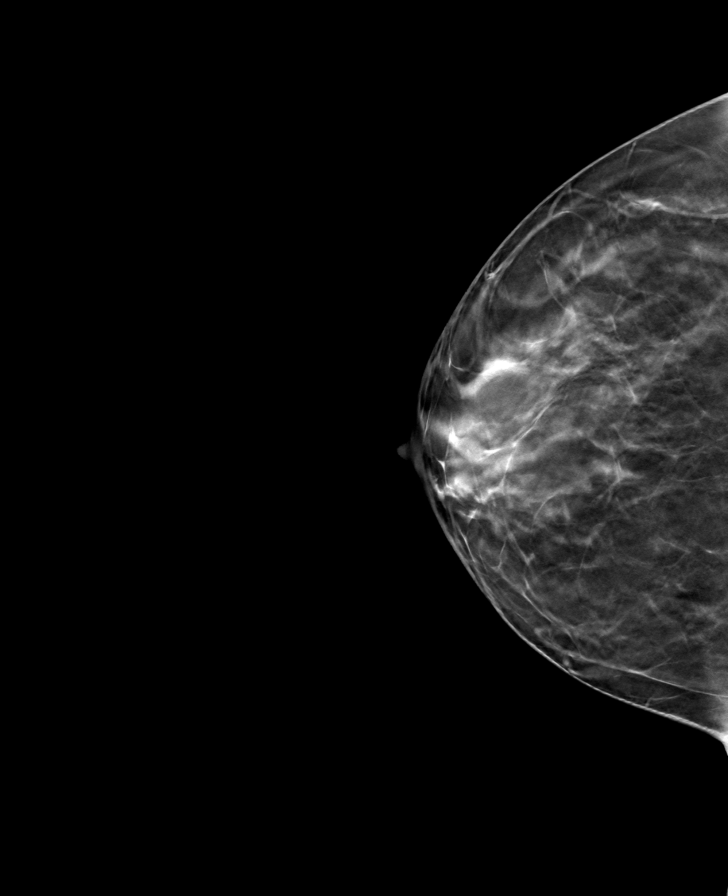

[L CC tomo · tomo slice 35/69.0]
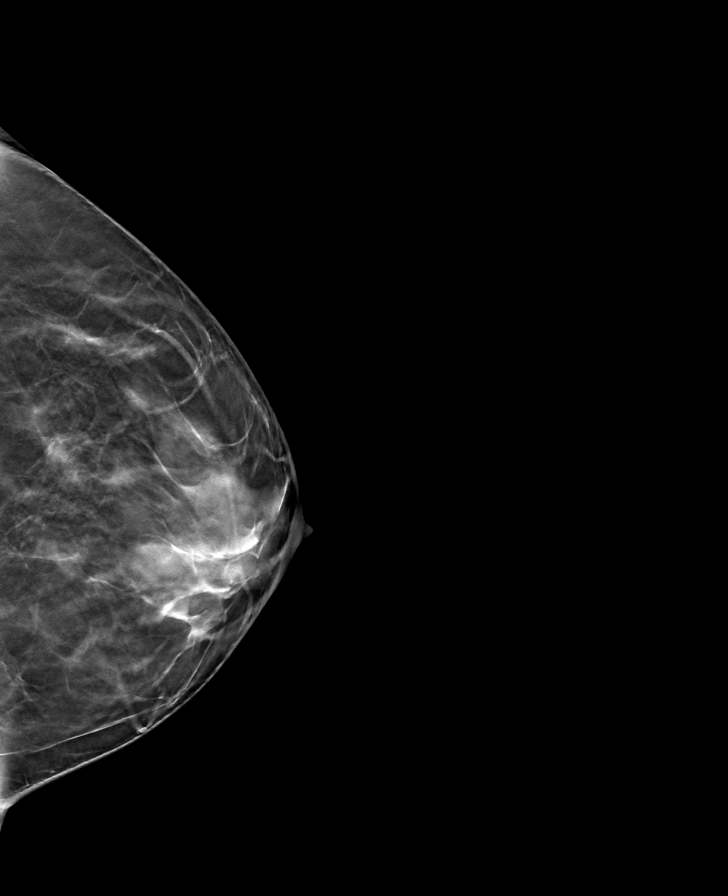

[8 of 24 positions shown; findings below may reference images not displayed]

ACR Breast Density Category c: The breast tissue is heterogeneously
dense, which may obscure small masses.
FINDINGS: In the left breast, possible distortion warrants further evaluation.
In the right breast, no findings suspicious for malignancy. Images
were processed with CAD.
IMPRESSION: Further evaluation is suggested for possible distortion in the left
breast.

RECOMMENDATION:
Diagnostic mammogram and possibly ultrasound of the left breast.
(Code:[id number])

The patient will be contacted regarding the findings, and additional
imaging will be scheduled.

BI-RADS CATEGORY  0: Incomplete. Need additional imaging evaluation
and/or prior mammograms for comparison.

## 2022-02-19 ENCOUNTER — Ambulatory Visit: Payer: Self-pay

## 2022-06-21 ENCOUNTER — Other Ambulatory Visit: Payer: Self-pay

## 2022-06-21 DIAGNOSIS — Z1231 Encounter for screening mammogram for malignant neoplasm of breast: Secondary | ICD-10-CM

## 2022-07-08 ENCOUNTER — Ambulatory Visit: Payer: Self-pay | Attending: Hematology and Oncology | Admitting: Hematology and Oncology

## 2022-07-08 ENCOUNTER — Ambulatory Visit
Admission: RE | Admit: 2022-07-08 | Discharge: 2022-07-08 | Disposition: A | Discharge status: Home / Self Care | Payer: Self-pay | Source: Ambulatory Visit | Attending: Obstetrics and Gynecology | Admitting: Obstetrics and Gynecology

## 2022-07-08 VITALS — BP 141/72 | Wt 169.2 lb

## 2022-07-08 DIAGNOSIS — Z1231 Encounter for screening mammogram for malignant neoplasm of breast: Secondary | ICD-10-CM

## 2022-07-08 NOTE — Patient Instructions (Signed)
Taught Kathryn May about self breast awareness and gave educational materials to take home. Patient did not need a Pap smear today due to last Pap smear was in 04/13/2019 per patient.  Let her know [facility] will cover Pap smears every 3 years unless has a history of abnormal Pap smears. Referred patient to the [facility] for screening mammogram. Appointment scheduled for 07/08/22. Patient aware of appointment and will be there. Let patient know will follow up with her within the next couple weeks with results. Kathryn May verbalized understanding.  Pascal Lux, NP 10:03 AM

## 2022-07-08 NOTE — Progress Notes (Signed)
Ms. Aleli Beed Elmon Else is a 57 y.o. female who presents to [facility] clinic today with no complaints.    Pap Smear: Pap not smear completed today. Last Pap smear was 04/13/2019 at [facility] and was normal. Per patient has no history of an abnormal Pap smear. Last Pap smear result is available in Epic.   Physical exam: Breasts Breasts symmetrical. No skin abnormalities bilateral breasts. No nipple retraction bilateral breasts. No nipple discharge bilateral breasts. No lymphadenopathy. No lumps palpated bilateral breasts.   MS DIGITAL SCREENING TOMO BILATERAL  Result Date: 07/04/2021 CLINICAL DATA:  Screening. EXAM: DIGITAL SCREENING BILATERAL MAMMOGRAM WITH TOMOSYNTHESIS AND CAD TECHNIQUE: Bilateral screening digital craniocaudal and mediolateral oblique mammograms were obtained. Bilateral screening digital breast tomosynthesis was performed. The images were evaluated with computer-aided detection. COMPARISON:  Previous exam(s). ACR Breast Density Category c: The breast tissue is heterogeneously dense, which may obscure small masses. FINDINGS: There are no findings suspicious for malignancy. IMPRESSION: No mammographic evidence of malignancy. A result letter of this screening mammogram will be mailed directly to the patient. RECOMMENDATION: Screening mammogram in one year. (Code:[id number]) BI-RADS CATEGORY  1: Negative. Electronically Signed   By: Hulan Saas M.D.   On: 07/04/2021 13:52   MS DIGITAL SCREENING TOMO BILATERAL  Result Date: 06/28/2020 CLINICAL DATA:  Screening. History of benign LEFT breast biopsy in 2021. EXAM: DIGITAL SCREENING BILATERAL MAMMOGRAM WITH TOMOSYNTHESIS AND CAD TECHNIQUE: Bilateral screening digital craniocaudal and mediolateral oblique mammograms were obtained. Bilateral screening digital breast tomosynthesis was performed. The images were evaluated with computer-aided detection. COMPARISON:  Previous exam(s). ACR Breast Density Category c: The breast tissue is  heterogeneously dense, which may obscure small masses. FINDINGS: There are no findings suspicious for malignancy. The images were evaluated with computer-aided detection. IMPRESSION: No mammographic evidence of malignancy. A result letter of this screening mammogram will be mailed directly to the patient. RECOMMENDATION: Screening mammogram in one year. (Code:[id number]) BI-RADS CATEGORY  1: Negative. Electronically Signed   By: Bary Richard M.D.   On: 06/28/2020 08:51   MS DIGITAL DIAG TOMO UNI LEFT  Result Date: 11/09/2019 CLINICAL DATA:  Patient for short-term follow-up of biopsy site left breast demonstrating benign pathology. EXAM: DIGITAL DIAGNOSTIC UNILATERAL LEFT MAMMOGRAM WITH TOMO AND CAD COMPARISON:  Previous exam(s). ACR Breast Density Category d: The breast tissue is extremely dense, which lowers the sensitivity of mammography. FINDINGS: Stable post biopsy changes left breast. No new masses, calcifications or nonsurgical distortion identified. Mammographic images were processed with CAD. IMPRESSION: No mammographic evidence for malignancy. Post biopsy clip left breast. RECOMMENDATION: Return to annual screening mammography 04/2020. I have discussed the findings and recommendations with the patient. If applicable, a reminder letter will be sent to the patient regarding the next appointment. BI-RADS CATEGORY  2: Benign. Electronically Signed   By: Annia Belt M.D.   On: 11/09/2019 14:08   MM LT BREAST BX W LOC DEV 1ST LESION IMAGE BX SPEC STEREO GUIDE  Addendum Date: 05/11/2019   ADDENDUM REPORT: 05/10/2019 13:15 ADDENDUM: PATHOLOGY revealed: A. BREAST, LEFT UPPER OUTER QUADRANT; STEREOTACTIC CORE BIOPSY: - BENIGN MAMMARY PARENCHYMA WITH PATCHY STROMAL FIBROSIS. - NEGATIVE FOR ATYPICAL PROLIFERATIVE BREAST DISEASE. Pathology results are CONCORDANT with imaging findings, per Dr. Gerome Sam. Pathology results and recommendations below were discussed with patient by telephone on 05/10/2019. Patient  reported biopsy site doing well with slight tenderness at the site. Post biopsy care instructions were reviewed and questions were answered. Patient was instructed to call [facility] if any  concerns or questions arise related to the biopsy. RECOMMENDATION: Patient instructed to return in six months for unilateral LEFT breast diagnostic mammogram to follow up on biopsied area. Patient informed a reminder notice will be sent regarding this appointment. Addendum by Randa Lynn RN on 05/10/2019. Electronically Signed   By: Gerome Sam III M.D   On: 05/10/2019 13:15   Result Date: 05/11/2019 CLINICAL DATA:  Stereotactic biopsy of the possible left distortion. EXAM: LEFT BREAST STEREOTACTIC CORE NEEDLE BIOPSY COMPARISON:  Previous exams. FINDINGS: The patient and I discussed the procedure of stereotactic-guided biopsy including benefits and alternatives. We discussed the high likelihood of a successful procedure. We discussed the risks of the procedure including infection, bleeding, tissue injury, clip migration, and inadequate sampling. Informed written consent was given. The usual time out protocol was performed immediately prior to the procedure. Using sterile technique and 1% Lidocaine as local anesthetic, under stereotactic guidance, a 9 gauge vacuum assisted device was used to perform core needle biopsy of possible distortion in the upper-outer left breast using a lateral approach. Lesion quadrant: Upper-outer left At the conclusion of the procedure, coil shaped tissue marker clip was deployed into the biopsy cavity. Follow-up 2-view mammogram was performed and dictated separately. IMPRESSION: Stereotactic-guided biopsy of possible left breast distortion. No apparent complications. Electronically Signed: By: Gerome Sam III M.D On: 05/06/2019 13:34   MS DIGITAL DIAG UNI LEFT  Result Date: 05/06/2019 CLINICAL DATA:  Evaluate biopsy marker EXAM: DIAGNOSTIC LEFT MAMMOGRAM POST STEREOTACTIC BIOPSY  COMPARISON:  Previous exam(s). FINDINGS: Mammographic images were obtained following stereotactic guided biopsy of possible left distortion. The biopsy marking clip is just posterior to the possible distortion. There is air in the region of possible distortion and I believe the region was sampled. IMPRESSION: Appropriate positioning of the coil shaped biopsy marking clip at the site of biopsy just posterior to the biopsied possible distortion. Final Assessment: Post Procedure Mammograms for Marker Placement Electronically Signed   By: Gerome Sam III M.D   On: 05/06/2019 13:41   MS DIGITAL DIAG TOMO UNI LEFT  Result Date: 04/30/2019 CLINICAL DATA:  The patient was called back for possible distortion in the left breast. EXAM: DIGITAL DIAGNOSTIC LEFT MAMMOGRAM WITH TOMO ULTRASOUND LEFT BREAST COMPARISON:  Previous exam(s). ACR Breast Density Category b: There are scattered areas of fibroglandular density. FINDINGS: Possible distortion in the superior left breast on the MLO view only persists on additional imaging. On physical exam, no suspicious lumps are identified. Targeted ultrasound is performed, showing no definitive sonographic correlate to the possible distortion in the left breast. No axillary adenopathy. IMPRESSION: Persistent possible distortion in the superior left breast on the MLO view only. RECOMMENDATION: Recommend stereotactic biopsy of the possible left breast distortion. I have discussed the findings and recommendations with the patient. If applicable, a reminder letter will be sent to the patient regarding the next appointment. BI-RADS CATEGORY  4: Suspicious. Electronically Signed   By: Gerome Sam III M.D   On: 04/30/2019 10:49   MS DIGITAL SCREENING TOMO BILATERAL  Result Date: 04/13/2019 CLINICAL DATA:  Screening. EXAM: DIGITAL SCREENING BILATERAL MAMMOGRAM WITH TOMO AND CAD COMPARISON:  Previous exam(s). ACR Breast Density Category c: The breast tissue is heterogeneously dense,  which may obscure small masses. FINDINGS: In the left breast, possible distortion warrants further evaluation. In the right breast, no findings suspicious for malignancy. Images were processed with CAD. IMPRESSION: Further evaluation is suggested for possible distortion in the left breast. RECOMMENDATION: Diagnostic mammogram and possibly  ultrasound of the left breast. (Code:[id number]) The patient will be contacted regarding the findings, and additional imaging will be scheduled. BI-RADS CATEGORY  0: Incomplete. Need additional imaging evaluation and/or prior mammograms for comparison. Electronically Signed   By: Edwin Cap M.D.   On: 04/13/2019 12:44   MS DIGITAL SCREENING TOMO BILATERAL  Result Date: 04/29/2018 CLINICAL DATA:  Screening. EXAM: DIGITAL SCREENING BILATERAL MAMMOGRAM WITH TOMO AND CAD COMPARISON:  Previous exam(s). ACR Breast Density Category b: There are scattered areas of fibroglandular density. FINDINGS: There are no findings suspicious for malignancy. Images were processed with CAD. IMPRESSION: No mammographic evidence of malignancy. A result letter of this screening mammogram will be mailed directly to the patient. RECOMMENDATION: Screening mammogram in one year. (Code:[id number]-01Y) BI-RADS CATEGORY  1: Negative. Electronically Signed   By: Baird Lyons M.D.   On: 04/29/2018 15:34        Pelvic/Bimanual Pap is not indicated today    Smoking History: Patient has never smoked and was not referred to quit line.    Patient Navigation: Patient education provided. Access to services provided for patient through [facility] program. Delos Haring interpreter provided. No transportation provided   Colorectal Cancer Screening: Per patient has never had colonoscopy completed No complaints today. FIT test in September 2023 negative.    Breast and Cervical Cancer Risk Assessment: Patient does not have family history of breast cancer, known genetic mutations, or radiation treatment to the chest  before age 69. Patient does not have history of cervical dysplasia, immunocompromised, or DES exposure in-utero.  Risk Assessment   No risk assessment data for the current encounter  Risk Scores       07/04/2021   Last edited by: Lesle Chris, RN   5-year risk: 0.6 %   Lifetime risk: 4.1 %            A: [facility] exam without pap smear No complaints with benign exam.   P: Referred patient to the [facility] for a screening mammogram. Appointment scheduled 07/08/2022.  Pascal Lux, NP 07/08/2022 9:32 AM

## 2023-08-19 ENCOUNTER — Other Ambulatory Visit: Payer: Self-pay | Admitting: Family Medicine

## 2023-08-19 DIAGNOSIS — Z1231 Encounter for screening mammogram for malignant neoplasm of breast: Secondary | ICD-10-CM

## 2023-09-01 ENCOUNTER — Ambulatory Visit
Admission: RE | Admit: 2023-09-01 | Discharge: 2023-09-01 | Disposition: A | Discharge status: Home / Self Care | Payer: Self-pay | Source: Ambulatory Visit | Attending: Family Medicine | Admitting: Family Medicine

## 2023-09-01 DIAGNOSIS — Z1231 Encounter for screening mammogram for malignant neoplasm of breast: Secondary | ICD-10-CM | POA: Insufficient documentation
